# Patient Record
Sex: Female | Born: 1950 | Race: White | Hispanic: No | Marital: Married | Smoking: Never smoker
Health system: Southern US, Community
[De-identification: ages and names within clinical notes are randomized; demographics above are authoritative.]

---

## 2017-10-08 ENCOUNTER — Emergency Department (HOSPITAL_BASED_OUTPATIENT_CLINIC_OR_DEPARTMENT_OTHER)
Admission: EM | Admit: 2017-10-08 | Discharge: 2017-10-08 | Disposition: A | Discharge status: Home / Self Care | Payer: Medicare Other | Attending: Emergency Medicine | Admitting: Emergency Medicine

## 2017-10-08 ENCOUNTER — Other Ambulatory Visit: Payer: Self-pay

## 2017-10-08 ENCOUNTER — Encounter (HOSPITAL_BASED_OUTPATIENT_CLINIC_OR_DEPARTMENT_OTHER): Payer: Self-pay | Admitting: *Deleted

## 2017-10-08 ENCOUNTER — Emergency Department (HOSPITAL_BASED_OUTPATIENT_CLINIC_OR_DEPARTMENT_OTHER): Payer: Medicare Other

## 2017-10-08 DIAGNOSIS — S01411A Laceration without foreign body of right cheek and temporomandibular area, initial encounter: Secondary | ICD-10-CM | POA: Insufficient documentation

## 2017-10-08 DIAGNOSIS — W01198A Fall on same level from slipping, tripping and stumbling with subsequent striking against other object, initial encounter: Secondary | ICD-10-CM | POA: Diagnosis not present

## 2017-10-08 DIAGNOSIS — Y929 Unspecified place or not applicable: Secondary | ICD-10-CM | POA: Insufficient documentation

## 2017-10-08 DIAGNOSIS — Y9389 Activity, other specified: Secondary | ICD-10-CM | POA: Insufficient documentation

## 2017-10-08 DIAGNOSIS — Z23 Encounter for immunization: Secondary | ICD-10-CM | POA: Insufficient documentation

## 2017-10-08 DIAGNOSIS — Y998 Other external cause status: Secondary | ICD-10-CM | POA: Insufficient documentation

## 2017-10-08 DIAGNOSIS — S0181XA Laceration without foreign body of other part of head, initial encounter: Secondary | ICD-10-CM

## 2017-10-08 DIAGNOSIS — S52124A Nondisplaced fracture of head of right radius, initial encounter for closed fracture: Secondary | ICD-10-CM

## 2017-10-08 DIAGNOSIS — S8001XA Contusion of right knee, initial encounter: Secondary | ICD-10-CM

## 2017-10-08 MED ORDER — TETANUS-DIPHTH-ACELL PERTUSSIS 5-2.5-18.5 LF-MCG/0.5 IM SUSP
0.5000 mL | Freq: Once | INTRAMUSCULAR | Status: AC
  Administered 2017-10-08: 0.5 mL via INTRAMUSCULAR
  Filled 2017-10-08: qty 0.5

## 2017-10-08 MED ORDER — IBUPROFEN 400 MG PO TABS
400.0000 mg | ORAL_TABLET | Freq: Four times a day (QID) | ORAL | 0 refills | Status: AC | PRN
Start: 2017-10-08 — End: ?

## 2017-10-08 MED ORDER — IBUPROFEN 400 MG PO TABS
400.0000 mg | ORAL_TABLET | Freq: Once | ORAL | Status: AC
  Administered 2017-10-08: 400 mg via ORAL
  Filled 2017-10-08: qty 1

## 2017-10-08 MED ORDER — LIDOCAINE HCL 2 % IJ SOLN
10.0000 mL | Freq: Once | INTRAMUSCULAR | Status: AC
  Administered 2017-10-08: 200 mg via INTRADERMAL
  Filled 2017-10-08: qty 20

## 2017-10-08 MED ORDER — HYDROCODONE-ACETAMINOPHEN 5-325 MG PO TABS: 1.0000 | ORAL_TABLET | Freq: Four times a day (QID) | ORAL | 0 refills | Status: AC | PRN

## 2017-10-08 NOTE — ED Notes (Signed)
Right arm to very tender to touch and patient is not able to move her right arm.

## 2017-10-08 NOTE — ED Provider Notes (Signed)
[facility] EMERGENCY DEPARTMENT Provider Note   CSN: [id number] Arrival date & time: 10/08/17  2004     History   Chief Complaint Chief Complaint  Patient presents with  . Fall    HPI Robin Faulkner is a 67 y.o. female.  HPI Robin Faulkner is a 67 y.o. female sensed emergency department after a fall.  Patient states she tripped while carrying a plastic box and fell.  States she hit her right side of the face on the plastic box.  She did not hit her head or lost consciousness.  She did injure her right elbow and right knee.  She is able to ambulate on her knee but states it is very painful.  She is unable to use her right arm.  No treatment prior to coming in.  Tetanus is unknown.  No dizziness or lightheadedness, no nausea or vomiting, no changes in vision, no memory loss, confusion, amnesia.  History reviewed. No pertinent past medical history.  There are no active problems to display for this patient.   History reviewed. No pertinent surgical history.   OB History   None      Home Medications    Prior to Admission medications   Not on File    Family History History reviewed. No pertinent family history.  Social History Social History   Tobacco Use  . Smoking status: Never Smoker  . Smokeless tobacco: Never Used  Substance Use Topics  . Alcohol use: Never    Frequency: Never  . Drug use: Never     Allergies   Patient has no known allergies.   Review of Systems Review of Systems  Constitutional: Negative for chills and fever.  HENT: Positive for nosebleeds.   Respiratory: Negative for cough, chest tightness and shortness of breath.   Cardiovascular: Negative for chest pain, palpitations and leg swelling.  Gastrointestinal: Negative for abdominal pain, diarrhea, nausea and vomiting.  Musculoskeletal: Positive for arthralgias and joint swelling. Negative for myalgias, neck pain and neck stiffness.  Skin: Positive for wound. Negative for  rash.  Neurological: Negative for dizziness, weakness and headaches.  All other systems reviewed and are negative.    Physical Exam Updated Vital Signs BP (!) 162/91 (BP Location: Left Arm)   Pulse 90   Temp 98 F (36.7 C) (Oral)   Resp 20   Ht 5\' 5"  (1.651 m)   Wt 83.9 kg (185 lb)   SpO2 95%   BMI 30.79 kg/m   Physical Exam  Constitutional: She appears well-developed and well-nourished. No distress.  HENT:  Head: Normocephalic.  Small superficial abrasion to the bridge of the nose.  Normal nostrils with no bleeding, no septal hematomas.  There is a 3 cm laceration below right eye.  With no bony tenderness surrounding the laceration.  Eyes: Pupils are equal, round, and reactive to light. Conjunctivae and EOM are normal.  Neck: Normal range of motion. Neck supple.  No midline tenderness  Cardiovascular: Normal rate, regular rhythm and normal heart sounds.  Pulmonary/Chest: Effort normal and breath sounds normal. No respiratory distress. She has no wheezes. She has no rales.  Abdominal: Soft. Bowel sounds are normal. She exhibits no distension. There is no tenderness. There is no rebound.  Musculoskeletal: She exhibits no edema.  Swelling noted to the right elbow.  Diffuse tenderness to palpation.  Unable to move due to pain.  Normal shoulder and wrist.  Distal radial pulses intact.  Contusion to the right anterior knee.  Diffuse tenderness  to palpation.  Pain with range of motion.  Joint is stable with negative anterior posterior drawer signs.  No laxity with medial lateral stress.  Neurological: She is alert.  Skin: Skin is warm and dry.  Psychiatric: She has a normal mood and affect. Her behavior is normal.  Nursing note and vitals reviewed.    ED Treatments / Results  Labs (all labs ordered are listed, but only abnormal results are displayed) Labs Reviewed - No data to display  EKG None  Radiology No results found.  Procedures .Marland KitchenLaceration Repair Date/Time:  10/08/2017 11:15 PM Performed by: Jaynie Crumble, PA-C Authorized by: Jaynie Crumble, PA-C   Consent:    Consent obtained:  Verbal   Consent given by:  Patient   Risks discussed:  Infection, pain, poor cosmetic result and need for additional repair Anesthesia (see MAR for exact dosages):    Anesthesia method:  Local infiltration   Local anesthetic:  Lidocaine 2% w/o epi Laceration details:    Location:  Face   Face location:  R cheek   Length (cm):  4 Repair type:    Repair type:  Simple Pre-procedure details:    Preparation:  Patient was prepped and draped in usual sterile fashion Exploration:    Hemostasis achieved with:  Direct pressure   Wound exploration: wound explored through full range of motion and entire depth of wound probed and visualized     Wound extent: no fascia violation noted, no nerve damage noted and no vascular damage noted   Treatment:    Area cleansed with:  Betadine and saline   Amount of cleaning:  Standard   Irrigation solution:  Sterile saline   Irrigation method:  Syringe Skin repair:    Repair method:  Sutures   Suture size:  6-0   Suture material:  Fast-absorbing gut   Suture technique:  Simple interrupted   Number of sutures:  4 Approximation:    Approximation:  Close Post-procedure details:    Dressing:  Open (no dressing)   Patient tolerance of procedure:  Tolerated well, no immediate complications   (including critical care time)  Medications Ordered in ED Medications  ibuprofen (ADVIL,MOTRIN) tablet 400 mg (400 mg Oral Given 10/08/17 2109)     Initial Impression / Assessment and Plan / ED Course  I have reviewed the triage vital signs and the nursing notes.  Pertinent labs & imaging results that were available during my care of the patient were reviewed by me and considered in my medical decision making (see chart for details).     Patient emergency department after mechanical fall.   Patient is not anticoagulated.   Other than hitting her face on the box, she states that she did not hit her head and there was no loss of consciousness.  She is mainly complaining of pain to the right elbow and right knee.  Will check get x-rays and repair her laceration.  11:11 PM Knee x-rays negative.  Right elbow x-ray shows nondisplaced radial head fracture.  Discussed with Dr. Eulah Pont, with orthopedics, who advised to place patient in a sling and have her follow-up with him in the office on Friday at 8:30 in the morning.  Laceration repaired.  Home with wound care, ice, elevation of the arm, pain medications  Vitals:   10/08/17 2014  BP: (!) 162/91  Pulse: 90  Resp: 20  Temp: 98 F (36.7 C)  TempSrc: Oral  SpO2: 95%  Weight: 83.9 kg (185 lb)  Height: 5\' 5"  (  1.651 m)    Final Clinical Impressions(s) / ED Diagnoses   Final diagnoses:  Facial laceration, initial encounter  Closed nondisplaced fracture of head of right radius, initial encounter  Contusion of right knee, initial encounter    ED Discharge Orders        Ordered    ibuprofen (ADVIL,MOTRIN) 400 MG tablet  Every 6 hours PRN     10/08/17 2313    HYDROcodone-acetaminophen (NORCO) 5-325 MG tablet  Every 6 hours PRN     10/08/17 2313       Jaynie Crumble, PA-C 10/08/17 2316    Terrilee Files, MD 10/09/17 1723

## 2017-10-08 NOTE — ED Triage Notes (Signed)
Pt states that she tripped and fell today at home. Denies LOC. Right elbow pain, facial lac below right eye with bleeding controlled.

## 2017-10-08 NOTE — Discharge Instructions (Addendum)
Ice and elevate your elbow in your knee.  Bacitracin to your laceration twice a day.  The sutures should dissolve on their own.  Please follow-up with Dr. Eulah PontMurphy on Friday at 8:30 in the morning for further evaluation and treatment of your elbow fracture.  Take ibuprofen for pain.  Norco for severe pain only.

## 2017-10-08 NOTE — ED Notes (Signed)
Pt is in xray

## 2017-10-08 NOTE — ED Notes (Signed)
ED Provider at bedside. 

## 2019-04-02 ENCOUNTER — Ambulatory Visit: Payer: Medicare Other | Attending: Internal Medicine

## 2019-04-02 DIAGNOSIS — Z23 Encounter for immunization: Secondary | ICD-10-CM | POA: Insufficient documentation

## 2019-04-02 NOTE — Progress Notes (Signed)
   Covid-19 Vaccination Clinic  Name:  Robin Faulkner    MRN: [id number] DOB: 07-10-1950  04/02/2019  Robin Faulkner was observed post Covid-19 immunization for 15 minutes without incidence. She was provided with Vaccine Information Sheet and instruction to access the V-Safe system.   Robin Faulkner was instructed to call 911 with any severe reactions post vaccine: Marland Kitchen Difficulty breathing  . Swelling of your face and throat  . A fast heartbeat  . A bad rash all over your body  . Dizziness and weakness    Immunizations Administered    Name Date Dose VIS Date Route   Pfizer COVID-19 Vaccine 04/02/2019 12:07 PM 0.3 mL 02/19/2019 Intramuscular   Manufacturer: [employer], [employer]   Lot: [id number]   NDC: [address]-7

## 2019-04-21 ENCOUNTER — Ambulatory Visit: Payer: Medicare Other | Attending: Internal Medicine

## 2019-04-21 DIAGNOSIS — Z23 Encounter for immunization: Secondary | ICD-10-CM

## 2019-04-21 NOTE — Progress Notes (Signed)
   Covid-19 Vaccination Clinic  Name:  [name]    MRN: [id number] DOB: Dec 22, 1950  04/21/2019  [name] was observed post Covid-19 immunization for 15 minutes without incidence. She was provided with Vaccine Information Sheet and instruction to access the V-Safe system.   [name] was instructed to call 911 with any severe reactions post vaccine: Marland Kitchen Difficulty breathing  . Swelling of your face and throat  . A fast heartbeat  . A bad rash all over your body  . Dizziness and weakness    Immunizations Administered    Name Date Dose VIS Date Route   Pfizer COVID-19 Vaccine 04/21/2019  8:14 AM 0.3 mL 02/19/2019 Intramuscular   Manufacturer: [employer], [employer]   Lot: [id number]   NDC: [address]-4

## 2019-04-23 ENCOUNTER — Ambulatory Visit: Payer: Medicare Other

## 2019-07-22 ENCOUNTER — Other Ambulatory Visit: Payer: Self-pay | Admitting: Family Medicine

## 2019-07-22 DIAGNOSIS — Z1231 Encounter for screening mammogram for malignant neoplasm of breast: Secondary | ICD-10-CM

## 2019-07-23 ENCOUNTER — Other Ambulatory Visit: Payer: Self-pay | Admitting: Family Medicine

## 2019-07-23 DIAGNOSIS — E2839 Other primary ovarian failure: Secondary | ICD-10-CM

## 2019-10-06 ENCOUNTER — Ambulatory Visit
Admission: RE | Admit: 2019-10-06 | Discharge: 2019-10-06 | Disposition: A | Discharge status: Home / Self Care | Payer: Medicare Other | Source: Ambulatory Visit | Attending: Family Medicine | Admitting: Family Medicine

## 2019-10-06 ENCOUNTER — Other Ambulatory Visit: Payer: Self-pay

## 2019-10-06 DIAGNOSIS — E2839 Other primary ovarian failure: Secondary | ICD-10-CM

## 2019-10-06 DIAGNOSIS — Z1231 Encounter for screening mammogram for malignant neoplasm of breast: Secondary | ICD-10-CM

## 2019-10-08 ENCOUNTER — Other Ambulatory Visit: Payer: Self-pay | Admitting: Family Medicine

## 2019-10-08 DIAGNOSIS — R928 Other abnormal and inconclusive findings on diagnostic imaging of breast: Secondary | ICD-10-CM

## 2019-10-20 ENCOUNTER — Other Ambulatory Visit: Payer: Self-pay | Admitting: Family Medicine

## 2019-10-20 ENCOUNTER — Ambulatory Visit
Admission: RE | Admit: 2019-10-20 | Discharge: 2019-10-20 | Disposition: A | Discharge status: Home / Self Care | Payer: Medicare Other | Source: Ambulatory Visit | Attending: Family Medicine | Admitting: Family Medicine

## 2019-10-20 ENCOUNTER — Other Ambulatory Visit: Payer: Self-pay

## 2019-10-20 DIAGNOSIS — N6320 Unspecified lump in the left breast, unspecified quadrant: Secondary | ICD-10-CM

## 2019-10-20 DIAGNOSIS — R928 Other abnormal and inconclusive findings on diagnostic imaging of breast: Secondary | ICD-10-CM

## 2019-10-29 ENCOUNTER — Ambulatory Visit
Admission: RE | Admit: 2019-10-29 | Discharge: 2019-10-29 | Disposition: A | Discharge status: Home / Self Care | Payer: Medicare Other | Source: Ambulatory Visit | Attending: Family Medicine | Admitting: Family Medicine

## 2019-10-29 ENCOUNTER — Other Ambulatory Visit: Payer: Self-pay

## 2019-10-29 DIAGNOSIS — N6320 Unspecified lump in the left breast, unspecified quadrant: Secondary | ICD-10-CM

## 2021-01-30 IMAGING — US US BREAST BX W LOC DEV 1ST LESION IMG BX SPEC US GUIDE*L*
1 series · 9 of 9 positions shown · non-contrast
Comparison: Previous exam(s).
COMPARISON: Previous exam(s).

Addendum:
CLINICAL DATA: 68-year-old female for tissue sampling of 0.7 cm
INNER LEFT breast mass.

EXAM:
ULTRASOUND GUIDED LEFT BREAST CORE NEEDLE BIOPSY

[Series 1: us breast bx w loc dev 1st lesion img bx spec us g · 0.04mm/px · 9 of 9 slices shown]
[im 1/9]
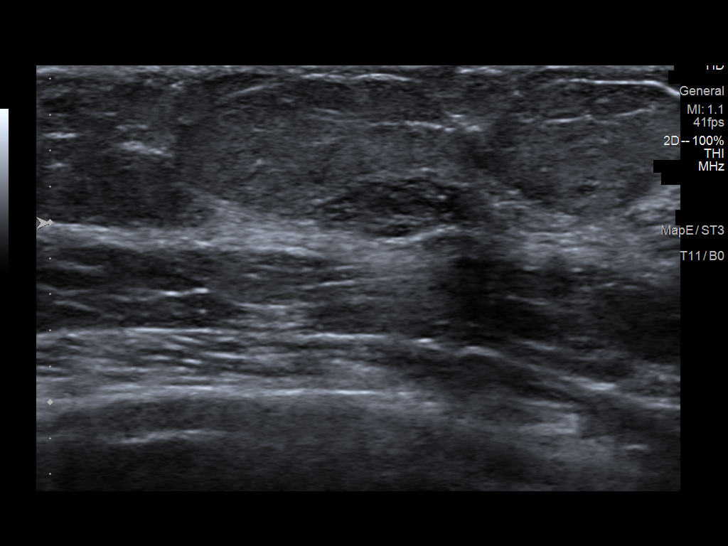
[im 2/9]
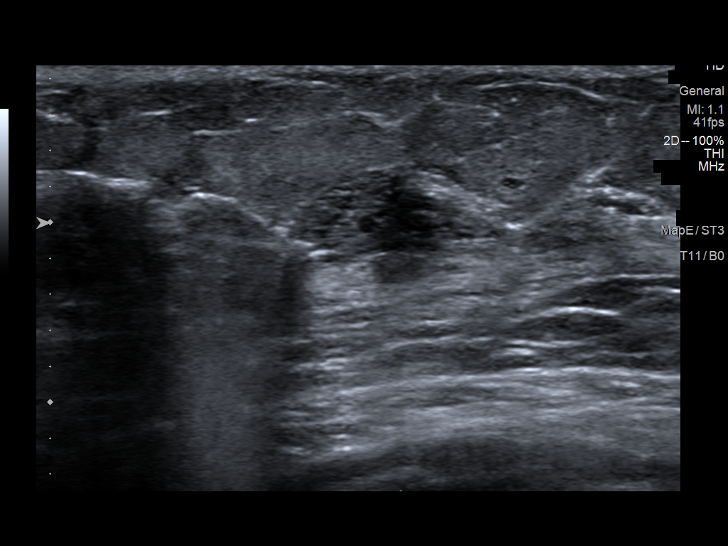
[im 3/9]
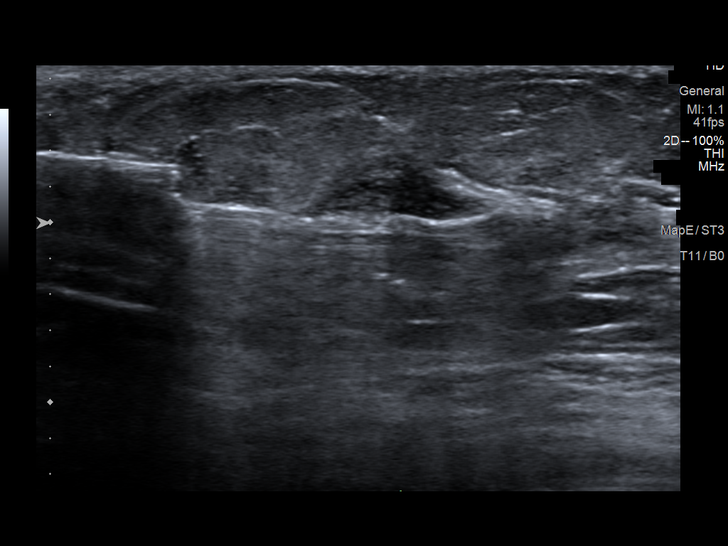
[im 4/9]
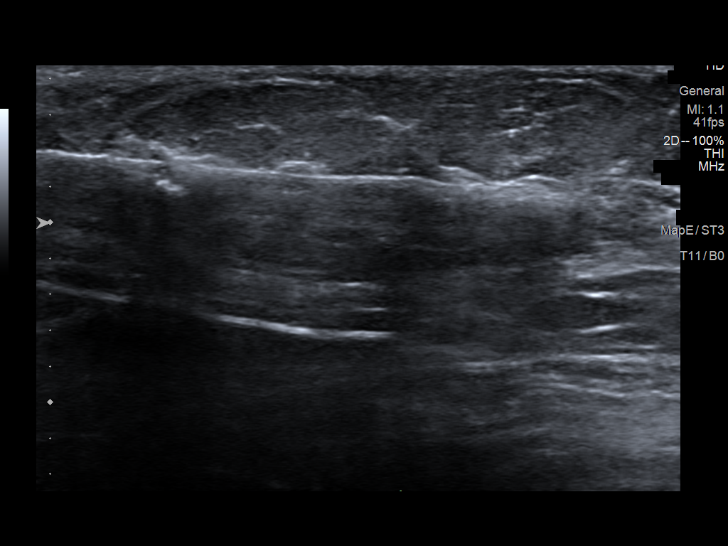
[im 5/9]
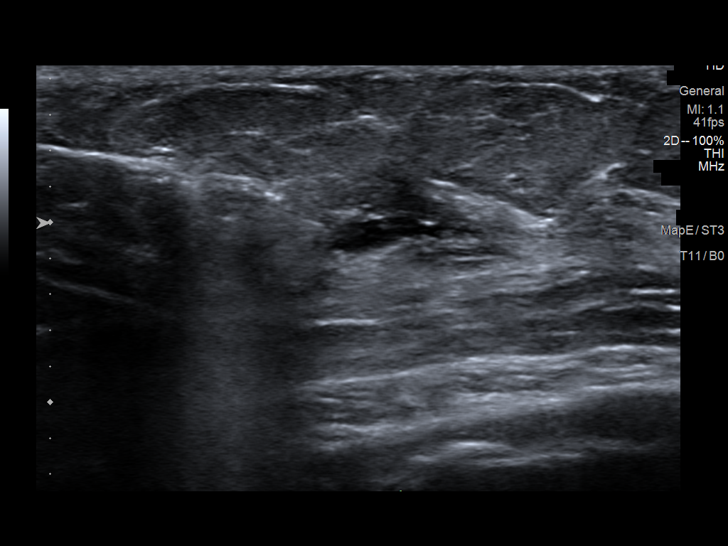
[im 6/9]
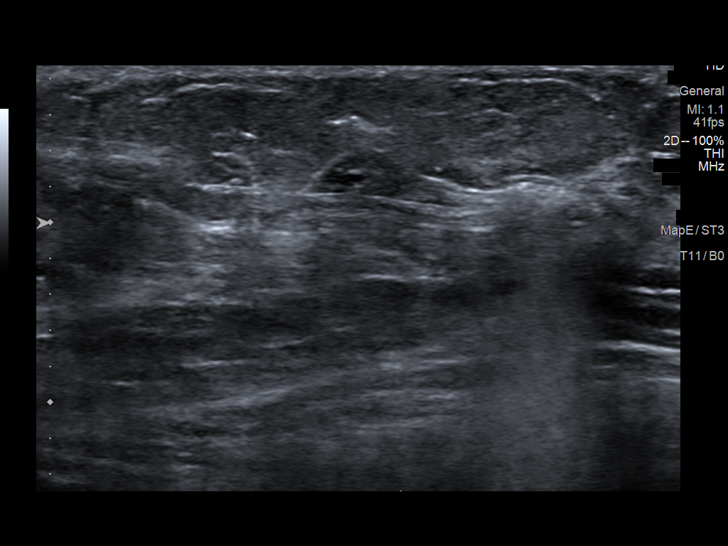
[im 7/9]
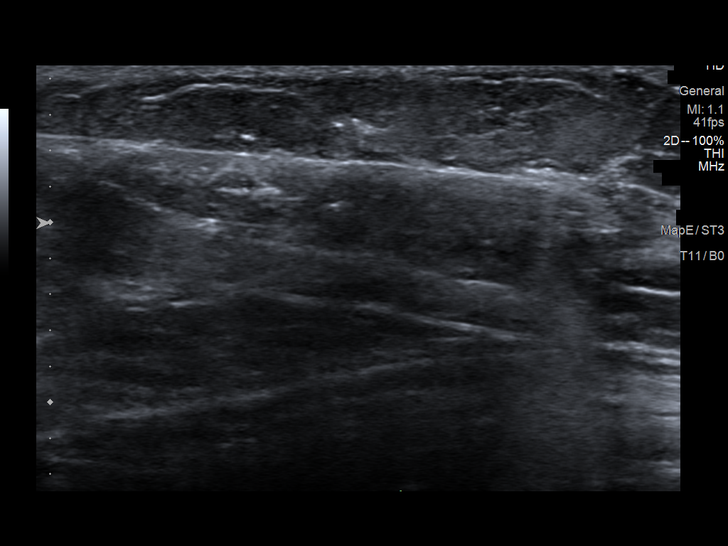
[im 8/9]
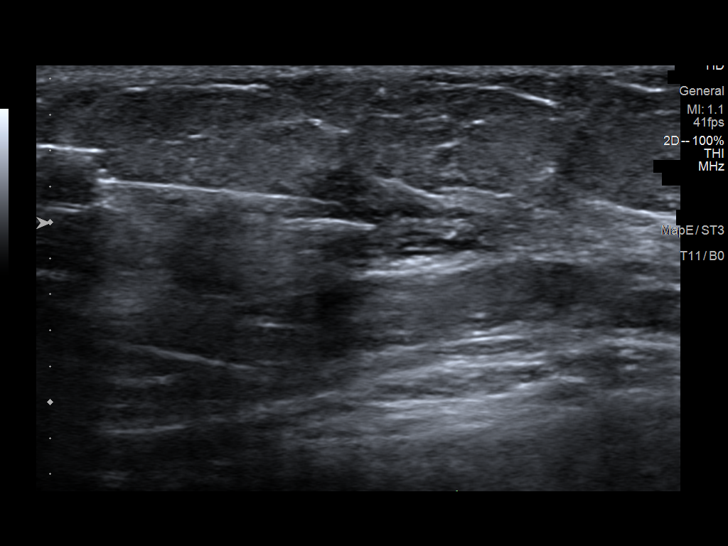
[im 9/9]
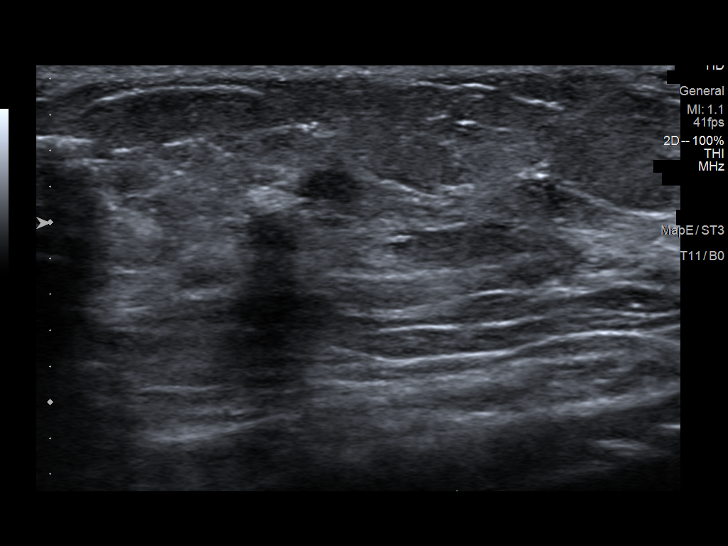

[9 of 9 positions shown; findings below may reference images not displayed]



Using sterile technique and 1% Lidocaine as local anesthetic, under
direct ultrasound visualization, a 12 gauge Delaine device was
used to perform biopsy of the 0.7 cm INNER LEFT breast mass using a
MEDIAL approach. At the conclusion of the procedure a RIBBON tissue
marker clip was deployed into the biopsy cavity. Follow up 2 view
mammogram was performed and dictated separately.
IMPRESSION: Ultrasound guided biopsy of 0.7 cm INNER LEFT breast mass. No
apparent complications.

ADDENDUM:
Pathology revealed PAPILLARY APOCRINE METAPLASIA of the LEFT breast,
inner, 9 o'clock. This was found to be concordant by Dr. Kiah Uche.

Pathology results were discussed with the patient by telephone. The
patient reported doing well after the biopsy with tenderness at the
site. Post biopsy instructions and care were reviewed and questions
were answered. The patient was encouraged to call The [REDACTED]

The patient was instructed to return for annual screening
mammography and informed a reminder notice would be sent regarding
this appointment.

Pathology results reported by Lorraine Jim RN on 11/01/2019.



Using sterile technique and 1% Lidocaine as local anesthetic, under
direct ultrasound visualization, a 12 gauge Delaine device was
used to perform biopsy of the 0.7 cm INNER LEFT breast mass using a
MEDIAL approach. At the conclusion of the procedure a RIBBON tissue
marker clip was deployed into the biopsy cavity. Follow up 2 view
mammogram was performed and dictated separately.
IMPRESSION: Ultrasound guided biopsy of 0.7 cm INNER LEFT breast mass. No
apparent complications.

## 2021-01-30 IMAGING — MG MM BREAST LOCALIZATION CLIP
4 series · 4 of 12 positions shown · non-contrast
Comparison: Previous exam(s).

CLINICAL DATA: Evaluate RIBBON clip following ultrasound-guided
LEFT breast biopsy.

EXAM:
DIAGNOSTIC LEFT MAMMOGRAM POST ULTRASOUND BIOPSY

[L ML synth-2D]
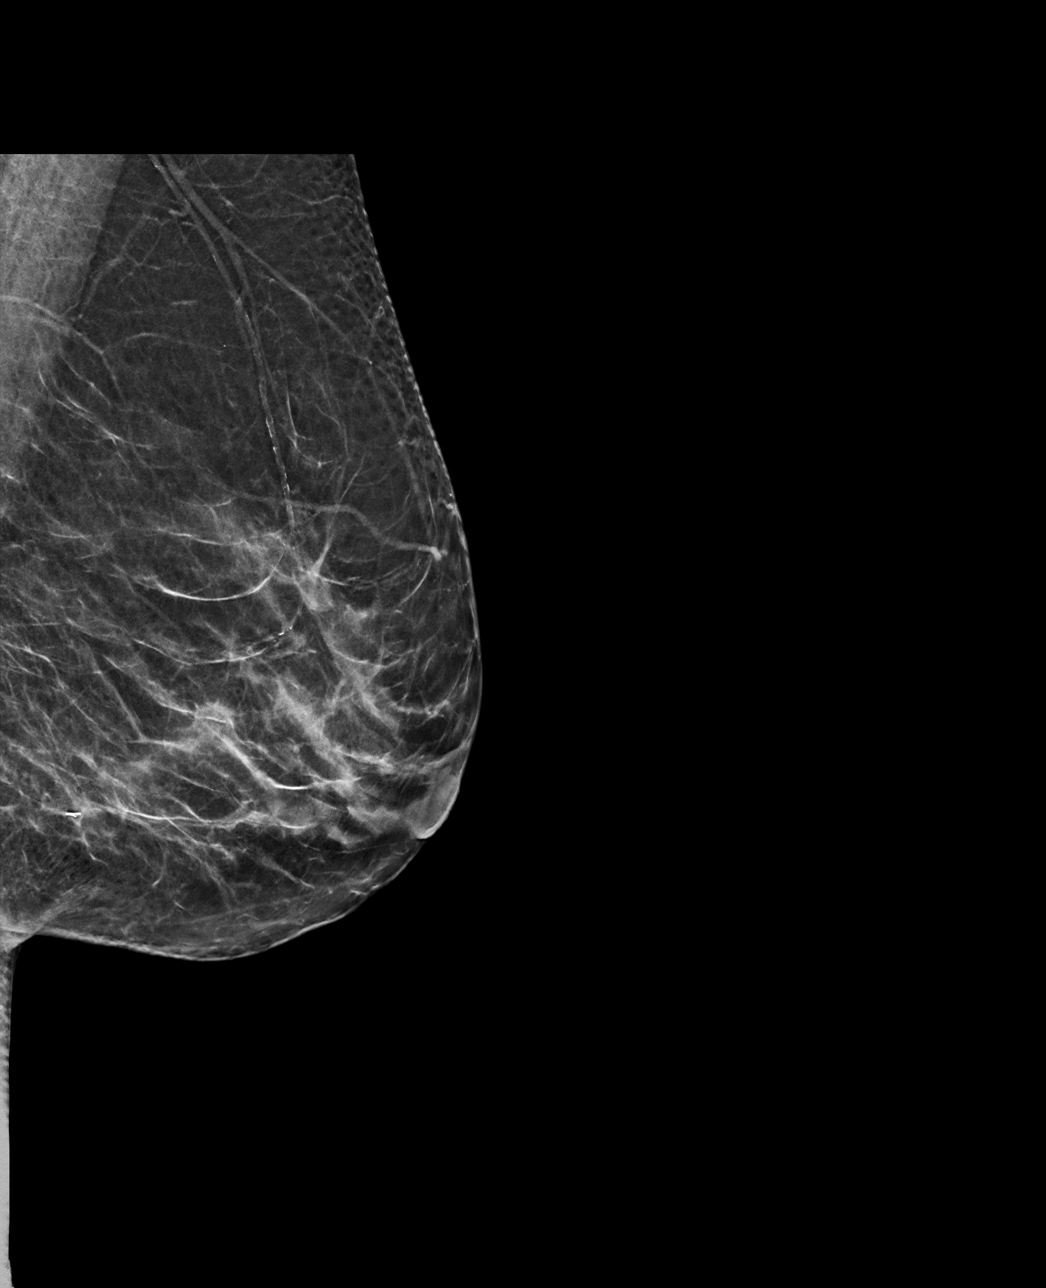

[L CC synth-2D]
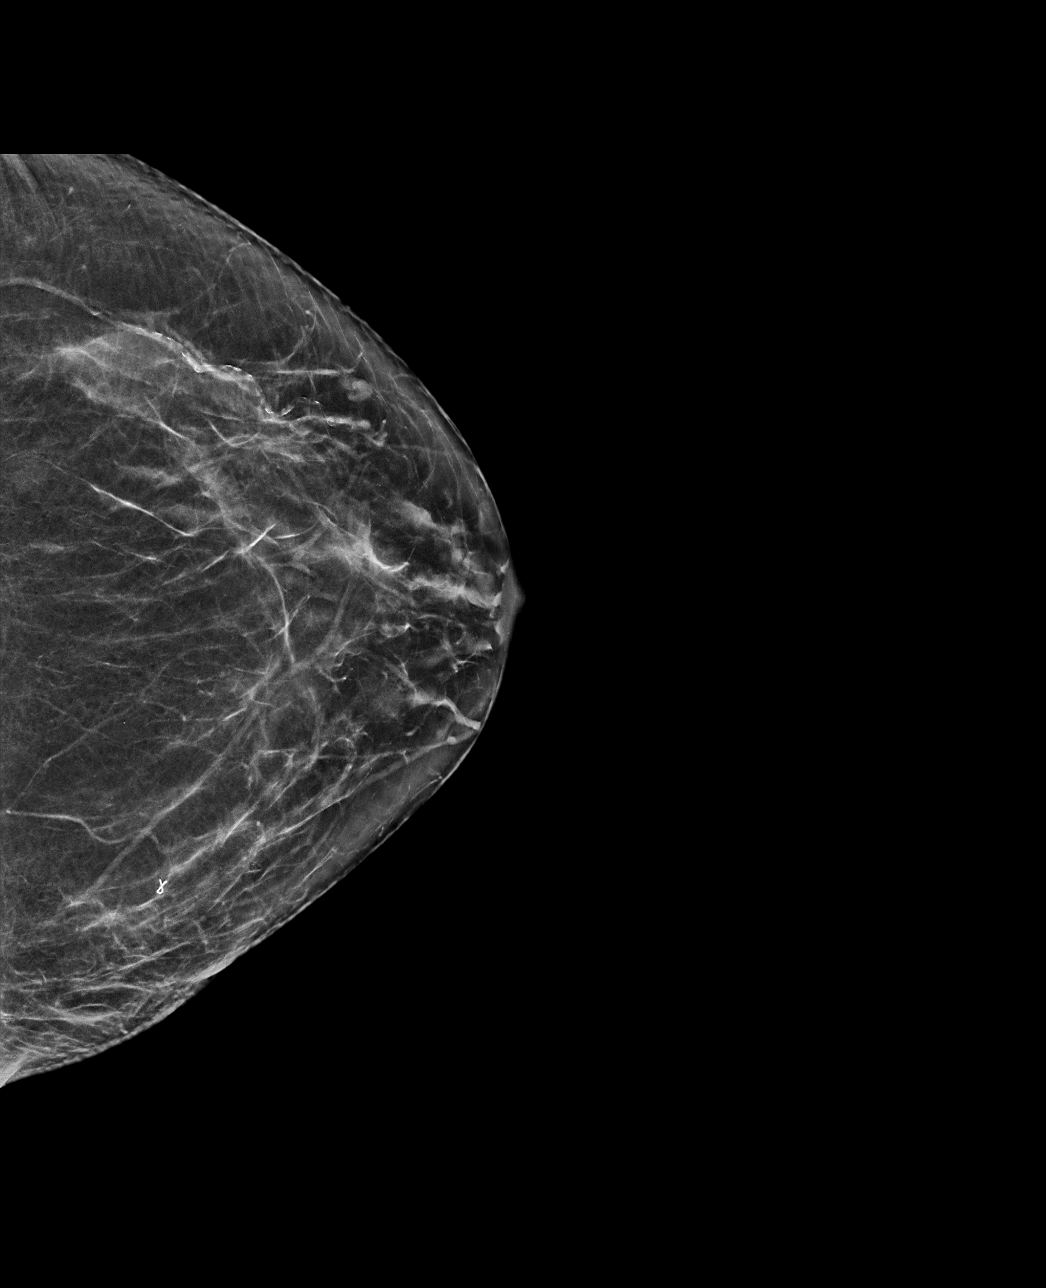

[L CC tomo · tomo slice 31/62.0]
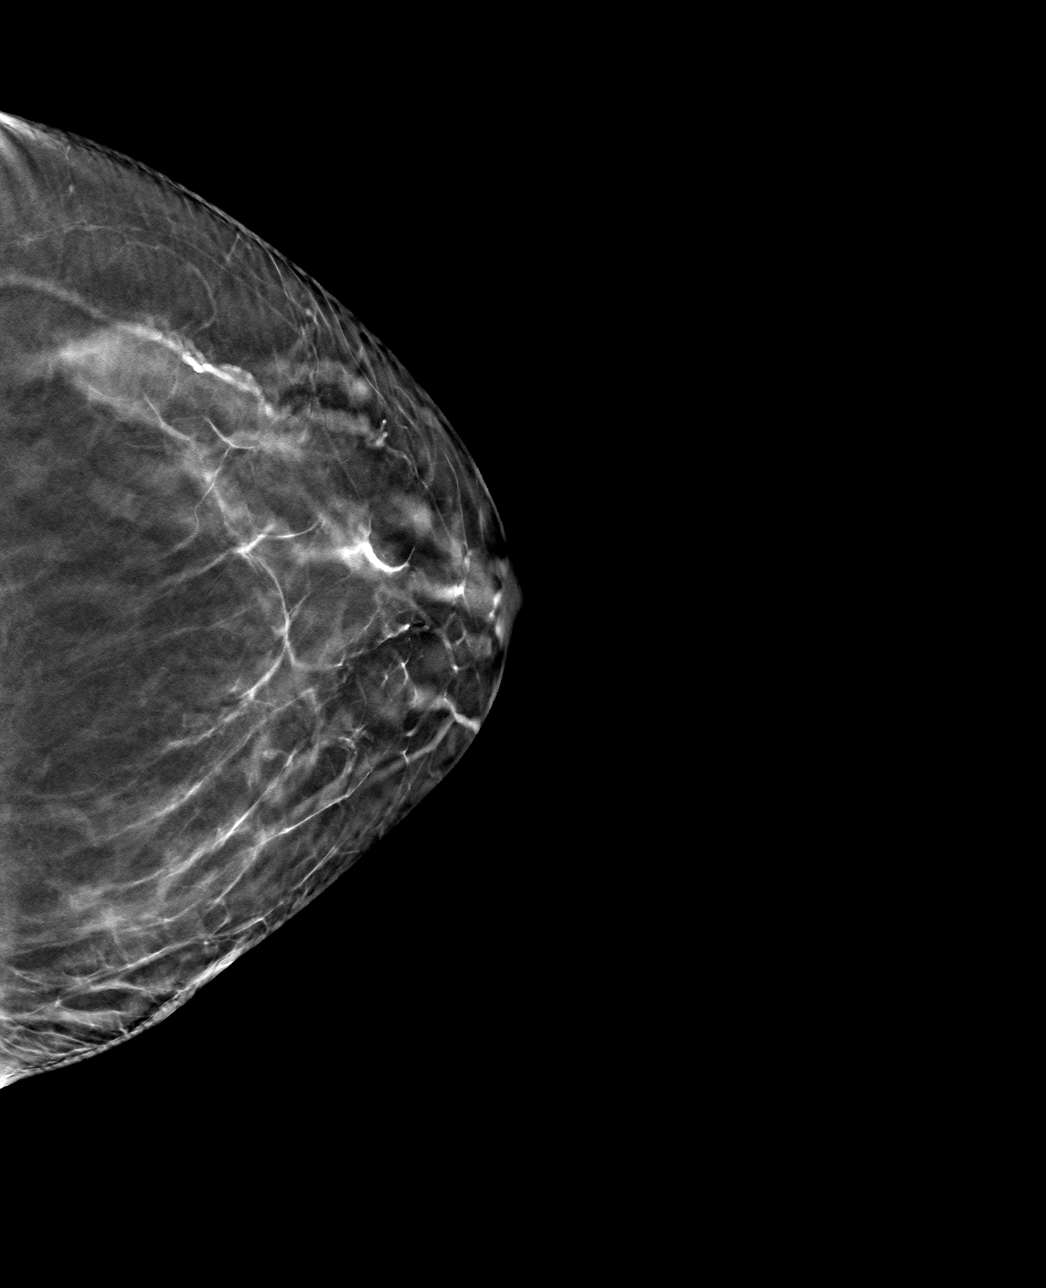

[L ML tomo · tomo slice 31/61.0]
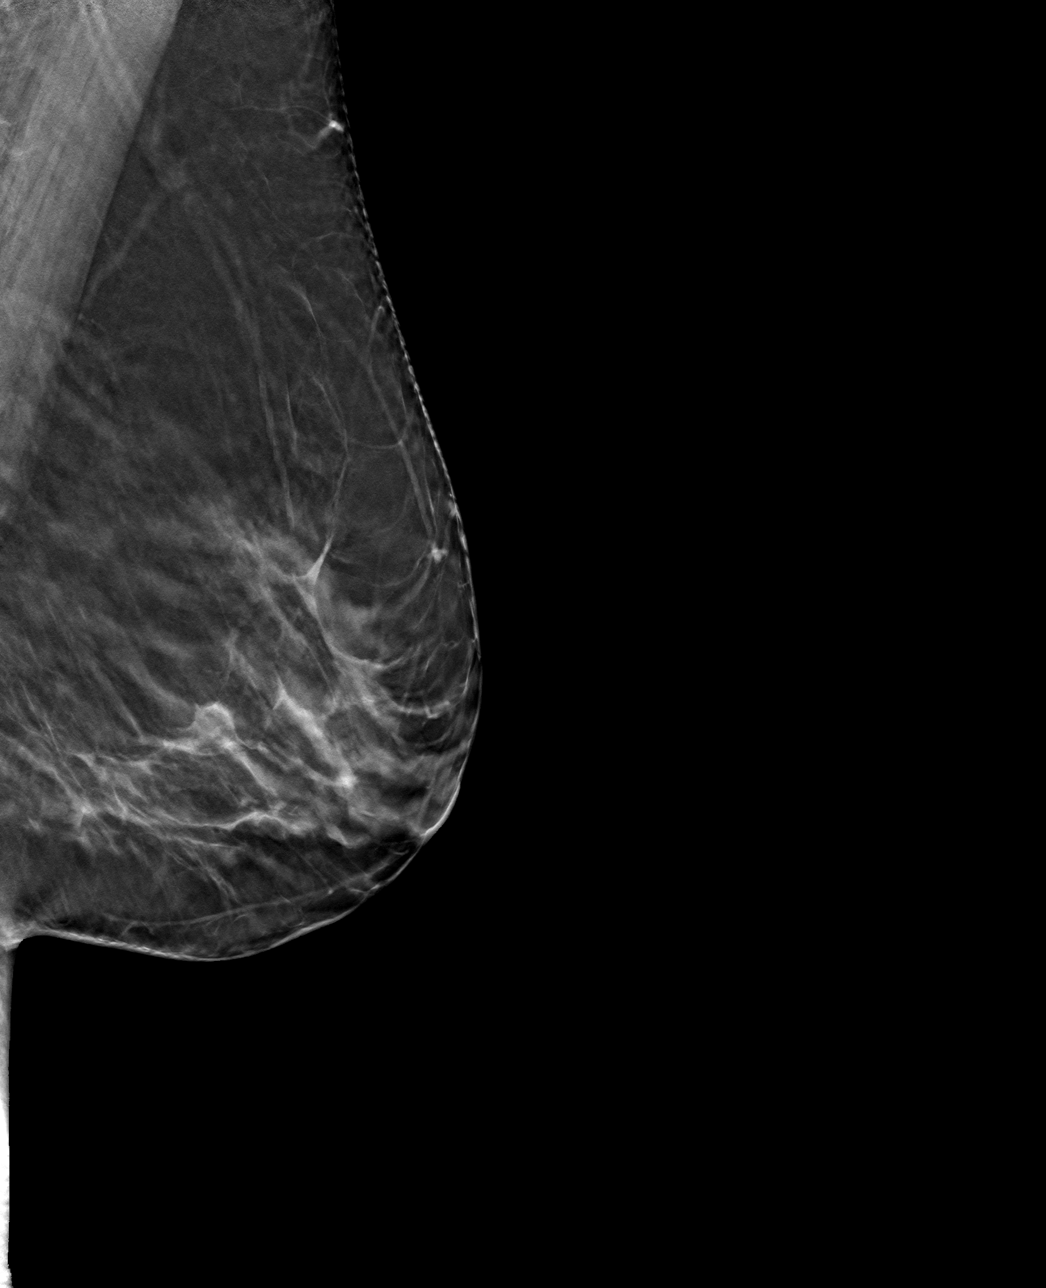

[4 of 12 positions shown; findings below may reference images not displayed]

FINDINGS: Mammographic images were obtained following ultrasound guided biopsy
of the 0.7 cm masses within the INNER LEFT breast. The RIBBON biopsy
marking clip is in expected position at the site of biopsy.
IMPRESSION: Appropriate positioning of the RIBBON shaped biopsy marking clip at
the site of biopsy in the INNER LEFT breast.

Final Assessment: Post Procedure Mammograms for Marker Placement

## 2021-08-01 ENCOUNTER — Other Ambulatory Visit: Payer: Self-pay | Admitting: Family Medicine

## 2021-08-01 DIAGNOSIS — Z8739 Personal history of other diseases of the musculoskeletal system and connective tissue: Secondary | ICD-10-CM

## 2021-08-20 ENCOUNTER — Other Ambulatory Visit: Payer: Self-pay | Admitting: Family Medicine

## 2021-08-20 DIAGNOSIS — Z1231 Encounter for screening mammogram for malignant neoplasm of breast: Secondary | ICD-10-CM

## 2021-12-05 ENCOUNTER — Ambulatory Visit
Admission: RE | Admit: 2021-12-05 | Discharge: 2021-12-05 | Disposition: A | Discharge status: Home / Self Care | Payer: Medicare Other | Source: Ambulatory Visit | Attending: Family Medicine | Admitting: Family Medicine

## 2021-12-05 DIAGNOSIS — Z1231 Encounter for screening mammogram for malignant neoplasm of breast: Secondary | ICD-10-CM

## 2021-12-05 DIAGNOSIS — Z8739 Personal history of other diseases of the musculoskeletal system and connective tissue: Secondary | ICD-10-CM

## 2022-08-02 DIAGNOSIS — Z0000 Encounter for general adult medical examination without abnormal findings: Secondary | ICD-10-CM | POA: Diagnosis not present

## 2022-09-04 DIAGNOSIS — N1831 Chronic kidney disease, stage 3a: Secondary | ICD-10-CM | POA: Diagnosis not present

## 2022-09-04 DIAGNOSIS — E785 Hyperlipidemia, unspecified: Secondary | ICD-10-CM | POA: Diagnosis not present

## 2022-09-04 DIAGNOSIS — I129 Hypertensive chronic kidney disease with stage 1 through stage 4 chronic kidney disease, or unspecified chronic kidney disease: Secondary | ICD-10-CM | POA: Diagnosis not present

## 2022-09-04 DIAGNOSIS — E559 Vitamin D deficiency, unspecified: Secondary | ICD-10-CM | POA: Diagnosis not present

## 2022-09-04 DIAGNOSIS — M810 Age-related osteoporosis without current pathological fracture: Secondary | ICD-10-CM | POA: Diagnosis not present

## 2022-10-02 DIAGNOSIS — H90A22 Sensorineural hearing loss, unilateral, left ear, with restricted hearing on the contralateral side: Secondary | ICD-10-CM | POA: Diagnosis not present

## 2022-10-18 DIAGNOSIS — Z86010 Personal history of colonic polyps: Secondary | ICD-10-CM | POA: Diagnosis not present

## 2022-10-18 DIAGNOSIS — K648 Other hemorrhoids: Secondary | ICD-10-CM | POA: Diagnosis not present

## 2022-10-18 DIAGNOSIS — K5730 Diverticulosis of large intestine without perforation or abscess without bleeding: Secondary | ICD-10-CM | POA: Diagnosis not present

## 2022-10-18 DIAGNOSIS — Z09 Encounter for follow-up examination after completed treatment for conditions other than malignant neoplasm: Secondary | ICD-10-CM | POA: Diagnosis not present

## 2022-10-18 DIAGNOSIS — Z800 Family history of malignant neoplasm of digestive organs: Secondary | ICD-10-CM | POA: Diagnosis not present

## 2023-02-27 DIAGNOSIS — N1831 Chronic kidney disease, stage 3a: Secondary | ICD-10-CM | POA: Diagnosis not present

## 2023-02-27 DIAGNOSIS — J069 Acute upper respiratory infection, unspecified: Secondary | ICD-10-CM | POA: Diagnosis not present

## 2023-09-08 DIAGNOSIS — H2513 Age-related nuclear cataract, bilateral: Secondary | ICD-10-CM | POA: Diagnosis not present

## 2023-09-08 DIAGNOSIS — H35033 Hypertensive retinopathy, bilateral: Secondary | ICD-10-CM | POA: Diagnosis not present

## 2023-09-16 DIAGNOSIS — I129 Hypertensive chronic kidney disease with stage 1 through stage 4 chronic kidney disease, or unspecified chronic kidney disease: Secondary | ICD-10-CM | POA: Diagnosis not present

## 2023-09-16 DIAGNOSIS — Z0000 Encounter for general adult medical examination without abnormal findings: Secondary | ICD-10-CM | POA: Diagnosis not present

## 2023-09-16 DIAGNOSIS — M810 Age-related osteoporosis without current pathological fracture: Secondary | ICD-10-CM | POA: Diagnosis not present

## 2023-09-16 DIAGNOSIS — E559 Vitamin D deficiency, unspecified: Secondary | ICD-10-CM | POA: Diagnosis not present

## 2023-09-16 DIAGNOSIS — N1831 Chronic kidney disease, stage 3a: Secondary | ICD-10-CM | POA: Diagnosis not present

## 2023-09-16 DIAGNOSIS — Z23 Encounter for immunization: Secondary | ICD-10-CM | POA: Diagnosis not present

## 2023-09-16 DIAGNOSIS — E785 Hyperlipidemia, unspecified: Secondary | ICD-10-CM | POA: Diagnosis not present

## 2023-10-15 DIAGNOSIS — N1831 Chronic kidney disease, stage 3a: Secondary | ICD-10-CM | POA: Diagnosis not present

## 2023-10-30 DIAGNOSIS — J069 Acute upper respiratory infection, unspecified: Secondary | ICD-10-CM | POA: Diagnosis not present

## 2023-11-09 ENCOUNTER — Other Ambulatory Visit (HOSPITAL_BASED_OUTPATIENT_CLINIC_OR_DEPARTMENT_OTHER): Payer: Self-pay | Admitting: Family Medicine

## 2023-11-09 DIAGNOSIS — M810 Age-related osteoporosis without current pathological fracture: Secondary | ICD-10-CM
